# Patient Record
Sex: Female | Born: 1990 | Race: Black or African American | Hispanic: No | Marital: Single | Smoking: Current every day smoker
Health system: Southern US, Community
[De-identification: ages and names within clinical notes are randomized; demographics above are authoritative.]

## PROBLEM LIST (undated history)

## (undated) DIAGNOSIS — D649 Anemia, unspecified: Secondary | ICD-10-CM

## (undated) DIAGNOSIS — A549 Gonococcal infection, unspecified: Secondary | ICD-10-CM

## (undated) DIAGNOSIS — A749 Chlamydial infection, unspecified: Secondary | ICD-10-CM

## (undated) DIAGNOSIS — N390 Urinary tract infection, site not specified: Secondary | ICD-10-CM

## (undated) HISTORY — DX: Gonococcal infection, unspecified: A54.9

## (undated) HISTORY — PX: NO PAST SURGERIES: SHX2092

## (undated) HISTORY — DX: Anemia, unspecified: D64.9

---
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
## 2012-08-29 ENCOUNTER — Inpatient Hospital Stay (HOSPITAL_COMMUNITY): Payer: Self-pay

## 2012-08-29 ENCOUNTER — Emergency Department (HOSPITAL_COMMUNITY)
Admission: EM | Admit: 2012-08-29 | Discharge: 2012-08-29 | Disposition: A | Discharge status: Home / Self Care | Payer: Self-pay | Source: Home / Self Care | Attending: Family Medicine | Admitting: Family Medicine

## 2012-08-29 ENCOUNTER — Inpatient Hospital Stay (HOSPITAL_COMMUNITY)
Admission: AD | Admit: 2012-08-29 | Discharge: 2012-08-29 | Disposition: A | Discharge status: Home / Self Care | Payer: Self-pay | Source: Ambulatory Visit | Attending: Obstetrics & Gynecology | Admitting: Obstetrics & Gynecology

## 2012-08-29 ENCOUNTER — Encounter (HOSPITAL_COMMUNITY): Payer: Self-pay | Admitting: *Deleted

## 2012-08-29 ENCOUNTER — Encounter (HOSPITAL_COMMUNITY): Payer: Self-pay | Admitting: Emergency Medicine

## 2012-08-29 DIAGNOSIS — Z3202 Encounter for pregnancy test, result negative: Secondary | ICD-10-CM | POA: Insufficient documentation

## 2012-08-29 DIAGNOSIS — N898 Other specified noninflammatory disorders of vagina: Secondary | ICD-10-CM

## 2012-08-29 DIAGNOSIS — N939 Abnormal uterine and vaginal bleeding, unspecified: Secondary | ICD-10-CM

## 2012-08-29 DIAGNOSIS — N949 Unspecified condition associated with female genital organs and menstrual cycle: Secondary | ICD-10-CM | POA: Insufficient documentation

## 2012-08-29 DIAGNOSIS — R1032 Left lower quadrant pain: Secondary | ICD-10-CM | POA: Insufficient documentation

## 2012-08-29 DIAGNOSIS — N83209 Unspecified ovarian cyst, unspecified side: Secondary | ICD-10-CM | POA: Insufficient documentation

## 2012-08-29 HISTORY — DX: Urinary tract infection, site not specified: N39.0

## 2012-08-29 HISTORY — DX: Chlamydial infection, unspecified: A74.9

## 2012-08-29 LAB — URINALYSIS, ROUTINE W REFLEX MICROSCOPIC
Glucose, UA: NEGATIVE mg/dL
Protein, ur: NEGATIVE mg/dL
pH: 6 (ref 5.0–8.0)

## 2012-08-29 LAB — URINE MICROSCOPIC-ADD ON

## 2012-08-29 LAB — WET PREP, GENITAL
Trich, Wet Prep: NONE SEEN
Yeast Wet Prep HPF POC: NONE SEEN

## 2012-08-29 LAB — CBC
HCT: 36 % (ref 36.0–46.0)
Hemoglobin: 12.6 g/dL (ref 12.0–15.0)
MCHC: 35 g/dL (ref 30.0–36.0)
RBC: 4 MIL/uL (ref 3.87–5.11)
WBC: 13 10*3/uL — ABNORMAL HIGH (ref 4.0–10.5)

## 2012-08-29 LAB — POCT PREGNANCY, URINE: Preg Test, Ur: NEGATIVE

## 2012-08-29 MED ORDER — IBUPROFEN 800 MG PO TABS
800.0000 mg | ORAL_TABLET | Freq: Once | ORAL | Status: AC
  Administered 2012-08-29: 800 mg via ORAL
  Filled 2012-08-29: qty 1

## 2012-08-29 NOTE — ED Notes (Addendum)
Pt here with poss. Ectopic pregnancy. C/o abdominal cramping  Post menstrual period 6/8-15/2014.sx started Tuesday  Heavy bleeding/clots. Denies n/v

## 2012-08-29 NOTE — ED Provider Notes (Signed)
History     CSN: [id number]  Arrival date & time 08/29/12  1652   None     Chief Complaint  Patient presents with  . Abdominal Cramping    (Consider location/radiation/quality/duration/timing/severity/associated sxs/prior treatment) Patient is a 22 y.o. female presenting with vaginal bleeding. The history is provided by the patient.  Vaginal Bleeding Quality:  Bright red and clots Severity:  Moderate Duration:  5 days Progression:  Unchanged Chronicity:  New (ended lnmp on 6/15, started vag bleeding again on 6/17, continues like menses, using only condoms for birth control, having assoc left adnexal pain assoc with bleeding.) Menstrual history:  Regular Possible pregnancy: yes   Associated symptoms: no back pain, no dysuria, no fever and no vaginal discharge     History reviewed. No pertinent past medical history.  History reviewed. No pertinent past surgical history.  No family history on file.  History  Substance Use Topics  . Smoking status: Not on file  . Smokeless tobacco: Not on file  . Alcohol Use: Not on file    OB History   Grav Para Term Preterm Abortions TAB SAB Ect Mult Living                  Review of Systems  Constitutional: Negative.  Negative for fever.  Genitourinary: Positive for vaginal bleeding, menstrual problem and pelvic pain. Negative for dysuria, frequency, hematuria, flank pain and vaginal discharge.  Musculoskeletal: Negative for back pain.    Allergies  Review of patient's allergies indicates no known allergies.  Home Medications  No current outpatient prescriptions on file.  BP 114/67  Pulse 99  Temp(Src) 98.6 F (37 C) (Oral)  SpO2 98%  LMP 08/15/2012  Physical Exam  Nursing note and vitals reviewed. Constitutional: She is oriented to person, place, and time. She appears well-developed and well-nourished.  Abdominal: Soft. Bowel sounds are normal. She exhibits no distension and no mass. There is tenderness in the  suprapubic area and left lower quadrant. There is no rigidity, no rebound and no guarding.    Neurological: She is alert and oriented to person, place, and time.  Skin: Skin is warm and dry.    ED Course  Procedures (including critical care time)  Labs Reviewed - No data to display No results found.   1. Vaginal bleeding, abnormal       MDM  upreg neg.  Discussed with Raynelle Fanning at [facility] MAU re transfer.        Linna Hoff, MD 08/29/12 [phone]

## 2012-08-29 NOTE — MAU Provider Note (Signed)
History     CSN: [id number]  Arrival date and time: 08/29/12 1801   First Provider Initiated Contact with Patient 08/29/12 1841      Chief Complaint  Patient presents with  . Abdominal Pain   HPI Ms. Karen Wyatt is a 22 y.o. G0P0 who presents to MAU today with vaginal bleeding and abdominal pain. The patient states that she had a normal period from 08/15/12 to 08/22/12 and then started bleeding again on 08/24/12 and has continued to bleed since. The patient also states LLQ pain since that time. She denies N/V/D or constipation, fever, weakness, fatigue, dizziness or UTI symptoms. She has had occasional mild headaches. She is not currently sexually active and has not been x months. She is not on birth control.   OB History   Grav Para Term Preterm Abortions TAB SAB Ect Mult Living   0               Past Medical History  Diagnosis Date  . UTI (lower urinary tract infection)   . Chlamydia     Past Surgical History  Procedure Laterality Date  . No past surgeries      History reviewed. No pertinent family history.  History  Substance Use Topics  . Smoking status: Current Every Day Smoker -- 7.00 packs/day for 1 years    Types: Cigarettes  . Smokeless tobacco: Not on file  . Alcohol Use: Yes     Comment: occasionally    Allergies: No Known Allergies  Prescriptions prior to admission  Medication Sig Dispense Refill  . ibuprofen (ADVIL,MOTRIN) 200 MG tablet Take 200 mg by mouth every 6 (six) hours as needed for pain.        Review of Systems  Constitutional: Negative for fever and malaise/fatigue.  Gastrointestinal: Positive for abdominal pain. Negative for nausea, vomiting, diarrhea and constipation.  Genitourinary: Negative for dysuria, urgency and frequency.       Neg - vaginal discharge + Vaginal bleeding  Neurological: Negative for dizziness, loss of consciousness and weakness.   Physical Exam   Blood pressure 117/56, pulse 91, temperature 98.6 F (37 C),  temperature source Oral, resp. rate 18, height 5\' 2"  (1.575 m), weight 147 lb 12.8 oz (67.042 kg), last menstrual period 08/15/2012.  Physical Exam  Constitutional: She is oriented to person, place, and time. She appears well-developed and well-nourished. No distress.  HENT:  Head: Normocephalic and atraumatic.  Cardiovascular: Normal rate, regular rhythm and normal heart sounds.   Respiratory: Effort normal and breath sounds normal. No respiratory distress.  GI: Soft. Bowel sounds are normal. She exhibits no distension and no mass. There is tenderness (moderate tenderness to palpation of the lower abdomen more prominent at midline). There is no rebound and no guarding.  Genitourinary: Uterus is tender. Uterus is not enlarged. Cervix exhibits no motion tenderness, no discharge and no friability. Right adnexum displays tenderness. Right adnexum displays no mass. Left adnexum displays no mass and no tenderness. There is bleeding (scant bleeding noted in the vagina) around the vagina. No vaginal discharge found.  Neurological: She is alert and oriented to person, place, and time.  Skin: Skin is warm and dry. No erythema.  Psychiatric: She has a normal mood and affect.   Results for orders placed during the hospital encounter of 08/29/12 (from the past 24 hour(s))  URINALYSIS, ROUTINE W REFLEX MICROSCOPIC     Status: Abnormal   Collection Time    08/29/12  6:15 PM  Result Value Range   Color, Urine YELLOW  YELLOW   APPearance CLEAR  CLEAR   Specific Gravity, Urine 1.025  1.005 - 1.030   pH 6.0  5.0 - 8.0   Glucose, UA NEGATIVE  NEGATIVE mg/dL   Hgb urine dipstick LARGE (*) NEGATIVE   Bilirubin Urine NEGATIVE  NEGATIVE   Ketones, ur NEGATIVE  NEGATIVE mg/dL   Protein, ur NEGATIVE  NEGATIVE mg/dL   Urobilinogen, UA 1.0  0.0 - 1.0 mg/dL   Nitrite NEGATIVE  NEGATIVE   Leukocytes, UA SMALL (*) NEGATIVE  URINE MICROSCOPIC-ADD ON     Status: Abnormal   Collection Time    08/29/12  6:15 PM       Result Value Range   Squamous Epithelial / LPF FEW (*) RARE   WBC, UA 3-6  <3 WBC/hpf   RBC / HPF 3-6  <3 RBC/hpf   Bacteria, UA FEW (*) RARE   Urine-Other MUCOUS PRESENT    POCT PREGNANCY, URINE     Status: None   Collection Time    08/29/12  6:19 PM      Result Value Range   Preg Test, Ur NEGATIVE  NEGATIVE  CBC     Status: Abnormal   Collection Time    08/29/12  6:53 PM      Result Value Range   WBC 13.0 (*) 4.0 - 10.5 K/uL   RBC 4.00  3.87 - 5.11 MIL/uL   Hemoglobin 12.6  12.0 - 15.0 g/dL   HCT 16.1  09.6 - 04.5 %   MCV 90.0  78.0 - 100.0 fL   MCH 31.5  26.0 - 34.0 pg   MCHC 35.0  30.0 - 36.0 g/dL   RDW 40.9  81.1 - 91.4 %   Platelets 216  150 - 400 K/uL  WET PREP, GENITAL     Status: Abnormal   Collection Time    08/29/12  7:00 PM      Result Value Range   Yeast Wet Prep HPF POC NONE SEEN  NONE SEEN   Trich, Wet Prep NONE SEEN  NONE SEEN   Clue Cells Wet Prep HPF POC FEW (*) NONE SEEN   WBC, Wet Prep HPF POC FEW (*) NONE SEEN    MAU Course  Procedures None  MDM UPT, UA, Wet prep, GC/Chlamydia, CBC and [place] today  Results for orders placed during the hospital encounter of 08/29/12 (from the past 24 hour(s))  URINALYSIS, ROUTINE W REFLEX MICROSCOPIC     Status: Abnormal   Collection Time    08/29/12  6:15 PM      Result Value Range   Color, Urine YELLOW  YELLOW   APPearance CLEAR  CLEAR   Specific Gravity, Urine 1.025  1.005 - 1.030   pH 6.0  5.0 - 8.0   Glucose, UA NEGATIVE  NEGATIVE mg/dL   Hgb urine dipstick LARGE (*) NEGATIVE   Bilirubin Urine NEGATIVE  NEGATIVE   Ketones, ur NEGATIVE  NEGATIVE mg/dL   Protein, ur NEGATIVE  NEGATIVE mg/dL   Urobilinogen, UA 1.0  0.0 - 1.0 mg/dL   Nitrite NEGATIVE  NEGATIVE   Leukocytes, UA SMALL (*) NEGATIVE  URINE MICROSCOPIC-ADD ON     Status: Abnormal   Collection Time    08/29/12  6:15 PM      Result Value Range   Squamous Epithelial / LPF FEW (*) RARE   WBC, UA 3-6  <3 WBC/hpf   RBC / HPF 3-6  <3 RBC/hpf  Bacteria, UA FEW (*) RARE   Urine-Other MUCOUS PRESENT    POCT PREGNANCY, URINE     Status: None   Collection Time    08/29/12  6:19 PM      Result Value Range   Preg Test, Ur NEGATIVE  NEGATIVE  CBC     Status: Abnormal   Collection Time    08/29/12  6:53 PM      Result Value Range   WBC 13.0 (*) 4.0 - 10.5 K/uL   RBC 4.00  3.87 - 5.11 MIL/uL   Hemoglobin 12.6  12.0 - 15.0 g/dL   HCT 62.1  30.8 - 65.7 %   MCV 90.0  78.0 - 100.0 fL   MCH 31.5  26.0 - 34.0 pg   MCHC 35.0  30.0 - 36.0 g/dL   RDW 84.6  96.2 - 95.2 %   Platelets 216  150 - 400 K/uL  WET PREP, GENITAL     Status: Abnormal   Collection Time    08/29/12  7:00 PM      Result Value Range   Yeast Wet Prep HPF POC NONE SEEN  NONE SEEN   Trich, Wet Prep NONE SEEN  NONE SEEN   Clue Cells Wet Prep HPF POC FEW (*) NONE SEEN   WBC, Wet Prep HPF POC FEW (*) NONE SEEN   US Transvaginal Non-ob  08/29/2012   *RADIOLOGY REPORT*  Clinical Data: Bleeding and pelvic pain  TRANSABDOMINAL AND TRANSVAGINAL ULTRASOUND OF PELVIS Technique:  Both transabdominal and transvaginal ultrasound examinations of the pelvis were performed. Transabdominal technique was performed for global imaging of the pelvis including uterus, ovaries, adnexal regions, and pelvic cul-de-sac.  It was necessary to proceed with endovaginal exam following the transabdominal exam to visualize the uterus and ovaries.  Comparison:  None  Findings:  Uterus: 6.5 x 3.3 x 3.2 cm.  Uterus appears normal  Endometrium: 5.1 mm normal  Right ovary:  Large right adnexal mass measures 6.7 x 6.0 x 5.5 cm. The mass has  homogeneous low level echoes throughout.  No large calcification identified.  Left ovary: Normal appearance/no adnexal mass  Other findings: Minimal free fluid  IMPRESSION: Complex cystic mass right adnexa.  This are diffuse low level echoes which can be seen with a dermoid cyst.  Endometrioma and infection are other possibilities.  MRI of the pelvis is suggested for further  evaluation.   Original Report Authenticated By: Janeece Riggers, M.D.   US Pelvis Complete  08/29/2012   *RADIOLOGY REPORT*  Clinical Data: Bleeding and pelvic pain  TRANSABDOMINAL AND TRANSVAGINAL ULTRASOUND OF PELVIS Technique:  Both transabdominal and transvaginal ultrasound examinations of the pelvis were performed. Transabdominal technique was performed for global imaging of the pelvis including uterus, ovaries, adnexal regions, and pelvic cul-de-sac.  It was necessary to proceed with endovaginal exam following the transabdominal exam to visualize the uterus and ovaries.  Comparison:  None  Findings:  Uterus: 6.5 x 3.3 x 3.2 cm.  Uterus appears normal  Endometrium: 5.1 mm normal  Right ovary:  Large right adnexal mass measures 6.7 x 6.0 x 5.5 cm. The mass has  homogeneous low level echoes throughout.  No large calcification identified.  Left ovary: Normal appearance/no adnexal mass  Other findings: Minimal free fluid  IMPRESSION: Complex cystic mass right adnexa.  This are diffuse low level echoes which can be seen with a dermoid cyst.  Endometrioma and infection are other possibilities.  MRI of the pelvis is suggested for further evaluation.  Original Report Authenticated By: Janeece Riggers, M.D.    2054: Spoke with Dr. Penne Lash: will do LDH and AFP tumor marker. Plan for outpatient MRI and FU in the clinic in 3 weeks.    Assessment and Plan  2000 - Patient in [place]. Care turned over to [facility], CNM  Freddi Starr, PA-C  08/29/2012, 8:05 PM   1. Other and unspecified ovarian cyst    AFP, LDH pending Will do outpatient MRI of the pelvis FU with the GYN clinic in 3 weeks

## 2012-08-29 NOTE — MAU Note (Signed)
Pt reports she started having abd pain a vag bleeding that started on Tuesday. menstrual cycle ended last Sunday. LMP 08/15/12-08/22/12. Went to urgent care and was told to come here for further eval.

## 2012-08-31 LAB — URINE CULTURE: Culture: NO GROWTH

## 2012-08-31 LAB — GC/CHLAMYDIA PROBE AMP
CT Probe RNA: POSITIVE — AB
GC Probe RNA: POSITIVE — AB

## 2012-09-01 ENCOUNTER — Ambulatory Visit (INDEPENDENT_AMBULATORY_CARE_PROVIDER_SITE_OTHER): Payer: Self-pay | Admitting: Obstetrics and Gynecology

## 2012-09-01 VITALS — BP 105/65 | HR 84 | Temp 99.3°F | Ht 62.0 in | Wt 149.0 lb

## 2012-09-01 DIAGNOSIS — A5400 Gonococcal infection of lower genitourinary tract, unspecified: Secondary | ICD-10-CM

## 2012-09-01 DIAGNOSIS — A549 Gonococcal infection, unspecified: Secondary | ICD-10-CM

## 2012-09-01 DIAGNOSIS — A749 Chlamydial infection, unspecified: Secondary | ICD-10-CM

## 2012-09-01 MED ORDER — CEFTRIAXONE SODIUM 250 MG IJ SOLR
250.0000 mg | Freq: Once | INTRAMUSCULAR | Status: AC
  Administered 2012-09-01: 250 mg via INTRAMUSCULAR

## 2012-09-01 MED ORDER — AZITHROMYCIN 1 G PO PACK
1.0000 g | PACK | Freq: Once | ORAL | Status: AC
  Administered 2012-09-01: 1 g via ORAL

## 2012-09-01 NOTE — Progress Notes (Signed)
Std card filled and sent to [facility]

## 2012-09-03 ENCOUNTER — Encounter: Payer: Self-pay | Admitting: Obstetrics & Gynecology

## 2012-09-06 NOTE — MAU Provider Note (Signed)
Attestation of Attending Supervision of Advanced Practitioner (CNM/NP): Evaluation and management procedures were performed by the Advanced Practitioner under my supervision and collaboration. I have reviewed the Advanced Practitioner's note and chart, and I agree with the management and plan.  Ebin Palazzi H. 2:35 PM

## 2012-09-20 ENCOUNTER — Encounter: Payer: Self-pay | Admitting: Obstetrics & Gynecology

## 2012-10-01 ENCOUNTER — Encounter: Payer: Self-pay | Admitting: *Deleted

## 2012-10-25 ENCOUNTER — Encounter: Payer: Self-pay | Admitting: Obstetrics & Gynecology

## 2012-12-10 ENCOUNTER — Encounter: Payer: Self-pay | Admitting: Obstetrics & Gynecology

## 2013-06-29 ENCOUNTER — Encounter: Payer: Self-pay | Admitting: Obstetrics & Gynecology

## 2013-06-29 ENCOUNTER — Ambulatory Visit (INDEPENDENT_AMBULATORY_CARE_PROVIDER_SITE_OTHER): Payer: Self-pay | Admitting: Obstetrics & Gynecology

## 2013-06-29 VITALS — BP 119/63 | HR 81 | Temp 98.2°F | Ht 62.0 in | Wt 158.0 lb

## 2013-06-29 DIAGNOSIS — Z7251 High risk heterosexual behavior: Secondary | ICD-10-CM

## 2013-06-29 DIAGNOSIS — L02219 Cutaneous abscess of trunk, unspecified: Secondary | ICD-10-CM

## 2013-06-29 DIAGNOSIS — N83209 Unspecified ovarian cyst, unspecified side: Secondary | ICD-10-CM

## 2013-06-29 DIAGNOSIS — L03319 Cellulitis of trunk, unspecified: Secondary | ICD-10-CM

## 2013-06-29 DIAGNOSIS — L02215 Cutaneous abscess of perineum: Secondary | ICD-10-CM

## 2013-06-29 NOTE — Progress Notes (Signed)
Subjective:     Patient ID: Karen Wyatt, female   DOB: [birth year], 23 y.o.   MRN: [id number]  HPI Pt in to f/u on ov cyst that was noted 1 year prev.  She also had an STI on her last visit. Meds taken after ER visit.     Review of Systems     Objective:   Physical Exam BP 119/63  Pulse 81  Temp(Src) 98.2 F (36.8 C) (Oral)  Ht 5\' 2"  (1.575 m)  Wt 158 lb (71.668 kg)  BMI 28.89 kg/m2  LMP 06/01/2013  08/29/2012 Clinical Data: Bleeding and pelvic pain  TRANSABDOMINAL AND TRANSVAGINAL ULTRASOUND OF PELVIS  Technique: Both transabdominal and transvaginal ultrasound  examinations of the pelvis were performed. Transabdominal technique  was performed for global imaging of the pelvis including uterus,  ovaries, adnexal regions, and pelvic cul-de-sac.  It was necessary to proceed with endovaginal exam following the  transabdominal exam to visualize the uterus and ovaries.  Comparison: None  Findings:  Uterus: 6.5 x 3.3 x 3.2 cm. Uterus appears normal  Endometrium: 5.1 mm normal  Right ovary: Large right adnexal mass measures 6.7 x 6.0 x 5.5 cm.  The mass has homogeneous low level echoes throughout. No large  calcification identified.  Left ovary: Normal appearance/no adnexal mass  Other findings: Minimal free fluid  IMPRESSION:  Complex cystic mass right adnexa. This are diffuse low level  echoes which can be seen with a dermoid cyst. Endometrioma and  infection are other possibilities. MRI of the pelvis is suggested  for further evaluation      Assessment:     Ov cyst- need to check to see if it is still present       Plan:    Attestation of Attending Supervision of Resident: Evaluation and management procedures were performed by the [facility] under my supervision.  I have seen and examined the patient, reviewed the resident's note and chart, and I agree with the management and plan.  Anibal Hendersonarolyn L Harraway-Smith, M.D. 06/29/2013 4:53 PM  Pelvic sono

## 2013-06-29 NOTE — Progress Notes (Signed)
HPI:  Pt presents for f/u visit to discuss ovarian cyst seen on u/s during MAU visit last June. States she gets occasional pelvic pain weekly or every other week. Denies vaginal discharge. Had +gc and chlamydia at that MAU visit and [facility]. States she gets periods monthly. They last 6-7 days in length and are initially heavy. Is not on any birth control. Has been sexually active with just one female partner in the last year, no female partners in last year. Is not using any barrier methods w/ her female partner.   Has also had a "boil" on her bottom for the last 6 months. It has not drained. No fevers. It does not keep her from having sex or bowel movements.  ROS: See HPI  PMFSH: no surgical hx. No meds. Occasional alcohol. Smokes 4-6 cigs/day. No drugs.  PHYSICAL EXAM: BP 119/63  Pulse 81  Temp(Src) 98.2 F (36.8 C) (Oral)  Ht 5\' 2"  (1.575 m)  Wt 158 lb (71.668 kg)  BMI 28.89 kg/m2  LMP 06/01/2013 Gen: NAD, pleasant, cooperative HEENT: NCAT Lungs: NWOB Abdomen: soft, nontender to palpation GU: normal appearing external genitalia without lesions. Vagina is moist with thin white discharge. Cervix normal in appearance, ectropion present. No cervical motion tenderness or tenderness on bimanual exam. No adnexal masses palpable. ~3cm area of induration without fluctuance present on L inner buttock adjacent to the perineum. No erythema, skin breakdown, or drainage Neuro: grossly nonfocal, speech normal  ASSESSMENT/PLAN:  1. Ovarian cyst noted last June on u/s - repeat pelvic u/s to determine if cyst still present since has been almost 1 year - f/u in office in 4 weeks  2. Hx gonorrhea & chlamydia - re-screen today for gc/ct/trich  3. Buttock abscess: not ready for drainage yet - advise hot compresses 4 times daily - avoid squeezing  FOLLOW UP: F/u in 4 weeks to discuss u/s results.  GrenadaBrittany J. Pollie MeyerMcIntyre, MD Family Medicine PGY-2

## 2013-06-29 NOTE — Patient Instructions (Signed)

## 2013-06-30 LAB — GC/CHLAMYDIA PROBE AMP
CT PROBE, AMP APTIMA: NEGATIVE
GC PROBE AMP APTIMA: NEGATIVE

## 2013-07-11 ENCOUNTER — Ambulatory Visit (HOSPITAL_COMMUNITY)
Admission: RE | Admit: 2013-07-11 | Discharge: 2013-07-11 | Disposition: A | Discharge status: Home / Self Care | Payer: Self-pay | Source: Ambulatory Visit | Attending: Obstetrics & Gynecology | Admitting: Obstetrics & Gynecology

## 2013-07-11 DIAGNOSIS — N949 Unspecified condition associated with female genital organs and menstrual cycle: Secondary | ICD-10-CM | POA: Insufficient documentation

## 2013-07-11 DIAGNOSIS — N83209 Unspecified ovarian cyst, unspecified side: Secondary | ICD-10-CM

## 2013-08-03 ENCOUNTER — Telehealth: Payer: Self-pay | Admitting: *Deleted

## 2013-08-03 ENCOUNTER — Ambulatory Visit: Payer: Self-pay | Admitting: Obstetrics & Gynecology

## 2013-08-03 ENCOUNTER — Encounter: Payer: Self-pay | Admitting: *Deleted

## 2013-08-03 NOTE — Telephone Encounter (Signed)
Called patient to inform of missed appointment, no answer, left message for patient to call and reschedule appointment.  Letter sent.

## 2013-11-16 ENCOUNTER — Emergency Department (HOSPITAL_COMMUNITY)
Admission: EM | Admit: 2013-11-16 | Discharge: 2013-11-16 | Disposition: A | Discharge status: Home / Self Care | Payer: Self-pay | Attending: Emergency Medicine | Admitting: Emergency Medicine

## 2013-11-16 ENCOUNTER — Emergency Department (HOSPITAL_COMMUNITY): Payer: Self-pay

## 2013-11-16 ENCOUNTER — Encounter (HOSPITAL_COMMUNITY): Payer: Self-pay | Admitting: Emergency Medicine

## 2013-11-16 DIAGNOSIS — Z91040 Latex allergy status: Secondary | ICD-10-CM | POA: Insufficient documentation

## 2013-11-16 DIAGNOSIS — Z87440 Personal history of urinary (tract) infections: Secondary | ICD-10-CM | POA: Insufficient documentation

## 2013-11-16 DIAGNOSIS — Z8619 Personal history of other infectious and parasitic diseases: Secondary | ICD-10-CM | POA: Insufficient documentation

## 2013-11-16 DIAGNOSIS — R109 Unspecified abdominal pain: Secondary | ICD-10-CM | POA: Insufficient documentation

## 2013-11-16 DIAGNOSIS — D649 Anemia, unspecified: Secondary | ICD-10-CM | POA: Insufficient documentation

## 2013-11-16 DIAGNOSIS — N730 Acute parametritis and pelvic cellulitis: Secondary | ICD-10-CM | POA: Insufficient documentation

## 2013-11-16 DIAGNOSIS — F17200 Nicotine dependence, unspecified, uncomplicated: Secondary | ICD-10-CM | POA: Insufficient documentation

## 2013-11-16 DIAGNOSIS — Z3202 Encounter for pregnancy test, result negative: Secondary | ICD-10-CM | POA: Insufficient documentation

## 2013-11-16 LAB — CBC WITH DIFFERENTIAL/PLATELET
Basophils Absolute: 0 10*3/uL (ref 0.0–0.1)
Basophils Relative: 0 % (ref 0–1)
EOS ABS: 0.1 10*3/uL (ref 0.0–0.7)
Eosinophils Relative: 1 % (ref 0–5)
HCT: 32.5 % — ABNORMAL LOW (ref 36.0–46.0)
Hemoglobin: 11.3 g/dL — ABNORMAL LOW (ref 12.0–15.0)
LYMPHS ABS: 1.4 10*3/uL (ref 0.7–4.0)
LYMPHS PCT: 14 % (ref 12–46)
MCH: 30.8 pg (ref 26.0–34.0)
MCHC: 34.8 g/dL (ref 30.0–36.0)
MCV: 88.6 fL (ref 78.0–100.0)
Monocytes Absolute: 0.8 10*3/uL (ref 0.1–1.0)
Monocytes Relative: 8 % (ref 3–12)
NEUTROS PCT: 77 % (ref 43–77)
Neutro Abs: 7.7 10*3/uL (ref 1.7–7.7)
PLATELETS: 356 10*3/uL (ref 150–400)
RBC: 3.67 MIL/uL — AB (ref 3.87–5.11)
RDW: 12.4 % (ref 11.5–15.5)
WBC: 10.1 10*3/uL (ref 4.0–10.5)

## 2013-11-16 LAB — COMPREHENSIVE METABOLIC PANEL
ALK PHOS: 55 U/L (ref 39–117)
ALT: 8 U/L (ref 0–35)
AST: 12 U/L (ref 0–37)
Albumin: 2.8 g/dL — ABNORMAL LOW (ref 3.5–5.2)
Anion gap: 16 — ABNORMAL HIGH (ref 5–15)
BUN: 5 mg/dL — ABNORMAL LOW (ref 6–23)
CO2: 24 meq/L (ref 19–32)
Calcium: 9.1 mg/dL (ref 8.4–10.5)
Chloride: 98 mEq/L (ref 96–112)
Creatinine, Ser: 0.68 mg/dL (ref 0.50–1.10)
GFR calc non Af Amer: >90 mL/min (ref >90)
GLUCOSE: 98 mg/dL (ref 70–99)
POTASSIUM: 3.9 meq/L (ref 3.7–5.3)
SODIUM: 138 meq/L (ref 137–147)
TOTAL PROTEIN: 7.8 g/dL (ref 6.0–8.3)
Total Bilirubin: 0.5 mg/dL (ref 0.3–1.2)

## 2013-11-16 LAB — URINE MICROSCOPIC-ADD ON

## 2013-11-16 LAB — URINALYSIS, ROUTINE W REFLEX MICROSCOPIC
GLUCOSE, UA: NEGATIVE mg/dL
HGB URINE DIPSTICK: NEGATIVE
Ketones, ur: 15 mg/dL — AB
Nitrite: NEGATIVE
PROTEIN: 30 mg/dL — AB
Specific Gravity, Urine: 1.023 (ref 1.005–1.030)
Urobilinogen, UA: 4 mg/dL — ABNORMAL HIGH (ref 0.0–1.0)
pH: 6.5 (ref 5.0–8.0)

## 2013-11-16 LAB — WET PREP, GENITAL
Trich, Wet Prep: NONE SEEN
Yeast Wet Prep HPF POC: NONE SEEN

## 2013-11-16 LAB — HIV ANTIBODY (ROUTINE TESTING W REFLEX): HIV: NONREACTIVE

## 2013-11-16 LAB — RPR

## 2013-11-16 LAB — POC URINE PREG, ED: Preg Test, Ur: NEGATIVE

## 2013-11-16 MED ORDER — ONDANSETRON HCL 4 MG/2ML IJ SOLN
4.0000 mg | Freq: Once | INTRAMUSCULAR | Status: AC
Start: 2013-11-16 — End: 2013-11-16
  Administered 2013-11-16: 4 mg via INTRAVENOUS
  Filled 2013-11-16: qty 2

## 2013-11-16 MED ORDER — HYDROMORPHONE HCL PF 1 MG/ML IJ SOLN
1.0000 mg | Freq: Once | INTRAMUSCULAR | Status: AC
  Administered 2013-11-16: 1 mg via INTRAVENOUS
  Filled 2013-11-16: qty 1

## 2013-11-16 MED ORDER — ONDANSETRON HCL 4 MG PO TABS: 4.0000 mg | ORAL_TABLET | Freq: Four times a day (QID) | ORAL | Status: AC

## 2013-11-16 MED ORDER — ONDANSETRON HCL 4 MG/2ML IJ SOLN
4.0000 mg | Freq: Once | INTRAMUSCULAR | Status: AC
  Administered 2013-11-16: 4 mg via INTRAVENOUS
  Filled 2013-11-16: qty 2

## 2013-11-16 MED ORDER — IOHEXOL 300 MG/ML  SOLN
80.0000 mL | Freq: Once | INTRAMUSCULAR | Status: AC | PRN
  Administered 2013-11-16: 80 mL via INTRAVENOUS

## 2013-11-16 MED ORDER — SODIUM CHLORIDE 0.9 % IV BOLUS (SEPSIS)
1000.0000 mL | Freq: Once | INTRAVENOUS | Status: AC
  Administered 2013-11-16: 1000 mL via INTRAVENOUS

## 2013-11-16 MED ORDER — DOXYCYCLINE HYCLATE 100 MG PO CAPS: 100.0000 mg | ORAL_CAPSULE | Freq: Two times a day (BID) | ORAL | Status: AC

## 2013-11-16 MED ORDER — MORPHINE SULFATE 4 MG/ML IJ SOLN
4.0000 mg | Freq: Once | INTRAMUSCULAR | Status: AC
Start: 2013-11-16 — End: 2013-11-16
  Administered 2013-11-16: 4 mg via INTRAVENOUS
  Filled 2013-11-16: qty 1

## 2013-11-16 MED ORDER — IOHEXOL 300 MG/ML  SOLN
25.0000 mL | INTRAMUSCULAR | Status: AC
  Administered 2013-11-16: 25 mL via ORAL

## 2013-11-16 MED ORDER — CEFTRIAXONE SODIUM 500 MG IJ SOLR
250.0000 mg | Freq: Once | INTRAMUSCULAR | Status: AC
  Administered 2013-11-16: 250 mg via INTRAVENOUS
  Filled 2013-11-16 (×2): qty 250

## 2013-11-16 MED ORDER — HYDROMORPHONE HCL PF 1 MG/ML IJ SOLN
0.5000 mg | Freq: Once | INTRAMUSCULAR | Status: AC
  Administered 2013-11-16: 0.5 mg via INTRAVENOUS
  Filled 2013-11-16: qty 1

## 2013-11-16 MED ORDER — OXYCODONE-ACETAMINOPHEN 5-325 MG PO TABS: 1.0000 | ORAL_TABLET | Freq: Four times a day (QID) | ORAL | Status: AC | PRN

## 2013-11-16 NOTE — Discharge Instructions (Signed)
Pelvic Inflammatory Disease °Pelvic inflammatory disease (PID) refers to an infection in some or all of the female organs. The infection can be in the uterus, ovaries, fallopian tubes, or the surrounding tissues in the pelvis. PID can cause abdominal or pelvic pain that comes on suddenly (acute pelvic pain). PID is a serious infection because it can lead to lasting (chronic) pelvic pain or the inability to have children (infertile).  °CAUSES  °The infection is often caused by the normal bacteria found in the vaginal tissues. PID may also be caused by an infection that is spread during sexual contact. PID can also occur following:  °· The birth of a baby.   °· A miscarriage.   °· An abortion.   °· Major pelvic surgery.   °· The use of an intrauterine device (IUD).   °· A sexual assault.   °RISK FACTORS °Certain factors can put a person at higher risk for PID, such as: °· Being younger than 25 years. °· Being sexually active at a young age. °· Using nonbarrier contraception. °· Having multiple sexual partners. °· Having sex with someone who has symptoms of a genital infection. °· Using oral contraception. °Other times, certain behaviors can increase the possibility of getting PID, such as: °· Having sex during your period. °· Using a vaginal douche. °· Having an intrauterine device (IUD) in place. °SYMPTOMS  °· Abdominal or pelvic pain.   °· Fever.   °· Chills.   °· Abnormal vaginal discharge. °· Abnormal uterine bleeding.   °· Unusual pain shortly after finishing your period. °DIAGNOSIS  °Your caregiver will choose some of the following methods to make a diagnosis, such as:  °· Performing a physical exam and history. A pelvic exam typically reveals a very tender uterus and surrounding pelvis.   °· Ordering laboratory tests including a pregnancy test, blood tests, and urine test.  °· Ordering cultures of the vagina and cervix to check for a sexually transmitted infection (STI). °· Performing an ultrasound.    °· Performing a laparoscopic procedure to look inside the pelvis.   °TREATMENT  °· Antibiotic medicines may be prescribed and taken by mouth.   °· Sexual partners may be treated when the infection is caused by a sexually transmitted disease (STD).   °· Hospitalization may be needed to give antibiotics intravenously. °· Surgery may be needed, but this is rare. °It may take weeks until you are completely well. If you are diagnosed with PID, you should also be checked for human immunodeficiency virus (HIV).   °HOME CARE INSTRUCTIONS  °· If given, take your antibiotics as directed. Finish the medicine even if you start to feel better.   °· Only take over-the-counter or prescription medicines for pain, discomfort, or fever as directed by your caregiver.   °· Do not have sexual intercourse until treatment is completed or as directed by your caregiver. If PID is confirmed, your recent sexual partner(s) will need treatment.   °· Keep your follow-up appointments. °SEEK MEDICAL CARE IF:  °· You have increased or abnormal vaginal discharge.   °· You need prescription medicine for your pain.   °· You vomit.   °· You cannot take your medicines.   °· Your partner has an STD.   °SEEK IMMEDIATE MEDICAL CARE IF:  °· You have a fever.   °· You have increased abdominal or pelvic pain.   °· You have chills.   °· You have pain when you urinate.   °· You are not better after 72 hours following treatment.   °MAKE SURE YOU:  °· Understand these instructions. °· Will watch your condition. °· Will get help right away if you are not doing well or get worse. °  Document Released: 02/24/2005 Document Revised: 06/21/2012 Document Reviewed: 02/20/2011 °[employer]® Patient Information ©2015 [employer]. This information is not intended to replace advice given to you by your health care provider. Make sure you discuss any questions you have with your health care provider. ° °

## 2013-11-16 NOTE — ED Notes (Signed)
Pt has returned from being out of the department; pt placed back on continuous pulse oximetry and blood pressure cuff; family at bedside 

## 2013-11-16 NOTE — ED Notes (Signed)
Lower abd pain and rt flank pain x several days pt states that she did vomit a few days ago but only that once LMP was 9/2 and last BM was Monday normal. Denies vag d/c states hurts to cough

## 2013-11-16 NOTE — ED Provider Notes (Signed)
  Lab abnormalities nonspecific Pain managed - no vomiting GI follow up if CT negative or able to discharge Pain and nausea medication for home.  CT = TOA Per gyn: Rocephin, [place] to eval flow If normal flow outpatient tx with doxy and pain meds  5:25: Reassessment and introduction to the patient:  Patient resting comfortably. She is aware of plan and waiting for ultrasound now. No complaints. All questions answered.   7:20:  US showing TOA without torsion. As per discussion with GYN, patient can be discharged home with close GYN follow up and with Doxycycline Rx, pain management. Referral made to GI for evaluation of elevated bilirubin.   Arnoldo Hooker, PA-C 11/16/13 [id number]

## 2013-11-16 NOTE — ED Notes (Signed)
Spoke with CT. Patient on the list.

## 2013-11-16 NOTE — ED Notes (Signed)
PA at the bedside.

## 2013-11-16 NOTE — ED Notes (Signed)
Patient finished contrast. CT made aware.  

## 2013-11-16 NOTE — Discharge Planning (Signed)
[facility] Community Liaison   Spoke to patient about primary care resources and establishing care with a provider. Resource guide and my contact information provided for any future questions or concerns. No other Community Liaison needs identified at this time.

## 2013-11-16 NOTE — ED Provider Notes (Signed)
CSN: [id number]     Arrival date & time 11/16/13  0800 History   First MD Initiated Contact with Patient 11/16/13 [phone]     Chief Complaint  Patient presents with  . Abdominal Pain  . Flank Pain     (Consider location/radiation/quality/duration/timing/severity/associated sxs/prior Treatment) HPI  Patient to the ER with complaints of diffuse abdominal pain that she feels is worse bilateral lower abdomen, RUQ, periumbilical and epigastric abdomen without vomiting. She reports having pain two weeks ago before starting her menstrual cycle. Once her cycle started she no longer had pain. Her menstrual cycle completed yesterday and now her pain has returned. She reports dysuria but no vaginal discharge or pain. She has not had any past surgeries but does have history of frequent UTIs. Reports no bowel movement in the past 2 days which is unusual for her since she uses the restroom daily. She has not had fevers, diarrhea, vomiting, rectal bleeding, headache or myalgias.   Past Medical History  Diagnosis Date  . UTI (lower urinary tract infection)   . Chlamydia    Past Surgical History  Procedure Laterality Date  . No past surgeries     No family history on file. History  Substance Use Topics  . Smoking status: Current Every Day Smoker -- 7.00 packs/day for 1 years    Types: Cigarettes  . Smokeless tobacco: Not on file  . Alcohol Use: Yes     Comment: occasionally   OB History   Grav Para Term Preterm Abortions TAB SAB Ect Mult Living   0              Review of Systems   Review of Systems  Gen: no weight loss, fevers, chills, night sweats  Eyes: no occular draining, occular pain,  No visual changes  Nose: no epistaxis or rhinorrhea  Mouth: no dental pain, no sore throat  Neck: no neck pain  Lungs: No hemoptysis. No wheezing or coughing CV:  No palpitations, dependent edema or orthopnea. No chest pain Abd: no diarrhea. No nausea or vomiting, + abdominal pain  GU: no dysuria or  gross hematuria  MSK:  No muscle weakness, No muscular pain Neuro: no headache, no focal neurologic deficits  Skin: no rash , no wounds Psyche: no complaints of depression or anxiety    Allergies  Latex  Home Medications   Prior to Admission medications   Medication Sig Start Date End Date Taking? Authorizing Provider  ibuprofen (ADVIL,MOTRIN) 200 MG tablet Take 400 mg by mouth every 6 (six) hours as needed for headache or moderate pain.   Yes Historical Provider, MD   BP 109/52  Pulse 106  Temp(Src) 99.2 F (37.3 C) (Oral)  Resp 20  SpO2 99% Physical Exam  Nursing note and vitals reviewed. Constitutional: She appears well-developed and well-nourished. No distress.  HENT:  Head: Normocephalic and atraumatic.  Eyes: Pupils are equal, round, and reactive to light.  Neck: Normal range of motion. Neck supple.  Cardiovascular: Normal rate and regular rhythm.   Pulmonary/Chest: Effort normal.  Abdominal: Soft. Bowel sounds are normal. She exhibits no distension and no fluid wave. There is tenderness (mod/severe diffuse abdomenal pain that does not localize). There is no rigidity, no rebound, no guarding and no CVA tenderness.  Neurological: She is alert.  Skin: Skin is warm and dry.    ED Course  Procedures (including critical care time) Labs Review Labs Reviewed  CBC WITH DIFFERENTIAL - Abnormal; Notable for the following:  RBC 3.67 (*)    Hemoglobin 11.3 (*)    HCT 32.5 (*)    All other components within normal limits  URINALYSIS, ROUTINE W REFLEX MICROSCOPIC - Abnormal; Notable for the following:    Color, Urine AMBER (*)    APPearance CLOUDY (*)    Bilirubin Urine SMALL (*)    Ketones, ur 15 (*)    Protein, ur 30 (*)    Urobilinogen, UA 4.0 (*)    Leukocytes, UA MODERATE (*)    All other components within normal limits  URINE MICROSCOPIC-ADD ON - Abnormal; Notable for the following:    Squamous Epithelial / LPF MANY (*)    All other components within normal  limits  COMPREHENSIVE METABOLIC PANEL  POC URINE PREG, ED    Imaging Review No results found.   EKG Interpretation None      MDM   Final diagnoses:  None   9:18 am The patients urine shows her to have elevated Urobilinogen 4.0 which has not been seen in any of her previous visits. She has history of gonorrhea and chlamydia. Waiting for liver enzymes to result- possibly concerned for hepatitis or obstruction. Hemolytic anemia also a consideration, she has mildly low RBC, hemoglobin and HCT. The rest of her cbc is unremarkable. At end of visit, will give referral to GI to follow-up with this. Discussed results with patient.  CT scan of the abdomen ordered as plain films showed enteritis which typically is not so painful for patient on pelvic exam. The pelvic exam shows she has tenderness to bilateral ovaries. The CT shows possible tubo-ovarian abscess to left side. I spoke with [facility] who says that abscess is not likely with no fever and no white count elevation. She recommends treating like PID and giving Rocephin IV . Give Doxycycline 100 mg BID for 14 days. Reports even if there is abscess typically the treatment is outpatient. She also recommends obtaining US to r/o ovarian torsion.  At end of shift, patient handed off to [employer], PA-C. Pts ultrasound of ovaries are pending to r/o torsion.  Dorthula Matas, PA-C 11/16/13 1616

## 2013-11-16 NOTE — ED Notes (Signed)
Patient returned from CT

## 2013-11-16 NOTE — ED Notes (Signed)
Patient returned from US.

## 2013-11-17 LAB — GC/CHLAMYDIA PROBE AMP
CT Probe RNA: NEGATIVE
GC Probe RNA: NEGATIVE

## 2013-11-17 NOTE — ED Provider Notes (Signed)
Medical screening examination/treatment/procedure(s) were performed by non-physician practitioner and as supervising physician I was immediately available for consultation/collaboration.   EKG Interpretation None        Suly Vukelich, MD 11/17/13 0722 

## 2013-11-17 NOTE — ED Provider Notes (Signed)
Medical screening examination/treatment/procedure(s) were performed by non-physician practitioner and as supervising physician I was immediately available for consultation/collaboration.   EKG Interpretation None        Vanetta Mulders, MD 11/17/13 [phone]

## 2013-11-17 NOTE — Progress Notes (Signed)
  CARE MANAGEMENT ED NOTE 11/17/2013  Patient:  Karen Wyatt, Karen Wyatt   Account Number:  [id number]  Date Initiated:  11/17/2013  Documentation initiated by:  Ferdinand Cava  Subjective/Objective Assessment:   23 yo female contacted CM regarding affordability of ED discharge prescriptions     Subjective/Objective Assessment Detail:     Action/Plan:   Patient will use [facility] program to afford discharge prescriptions   Action/Plan Detail:   Anticipated DC Date:       Status Recommendation to Physician:   Result of Recommendation:  Agreed    DC Planning Services  [facility] Program    Choice offered to / List presented to:            Status of service:  Completed, signed off  ED Comments:   ED Comments Detail:  This CM received phone call from the patient and she stated that she was informed the previous day to contact the CM department regarding the [facility] program for her discharge prescriptions. This CM discussed the [facility] program with the patient and explained that a copay of $3.00 for each medication, narcotics are not covered in the system, and she can only use the program 1 time in a year and the prescription has to be filled within 7 days of the prescription date. The patient verbalized understanding and stated she would like to use the [facility] program. The patient stated that she uses the [employer] on [address]. This CM informed the patient that the [facility] letter would be faxed to [facility] of her choice and to contact the CM department if any questions or concerns arose with the [facility] letter. The patient verbalized understanding and had no other questions or concerns

## 2013-12-15 ENCOUNTER — Encounter: Payer: Self-pay | Admitting: Internal Medicine

## 2014-02-14 ENCOUNTER — Ambulatory Visit: Payer: Self-pay | Admitting: Internal Medicine

## 2015-03-23 IMAGING — CT CT ABD-PELV W/ CM
2 of 4 series · 16 of 46 positions shown, 18 images · IV contrast (Omni 300)
Comparison: None.

CLINICAL DATA: Right mid and lower abdominal pain.

EXAM:
CT ABDOMEN AND PELVIS WITH CONTRAST
TECHNIQUE: Multidetector CT imaging of the abdomen and pelvis was performed
using the standard protocol following bolus administration of
intravenous contrast.
CONTRAST:  80mL OMNIPAQUE IOHEXOL 300 MG/ML  SOLN

[Series 2: abd/ pelvis 5.0 i30f 1 · axial · 0.58mm/px · z∈[-150,+275]mm · 13 of 93 slices shown, 15 images]
[im 4/93  soft-tissue]
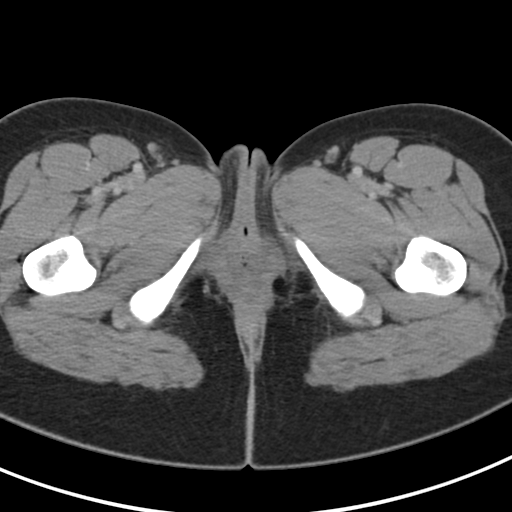
[im 4/93  bone]
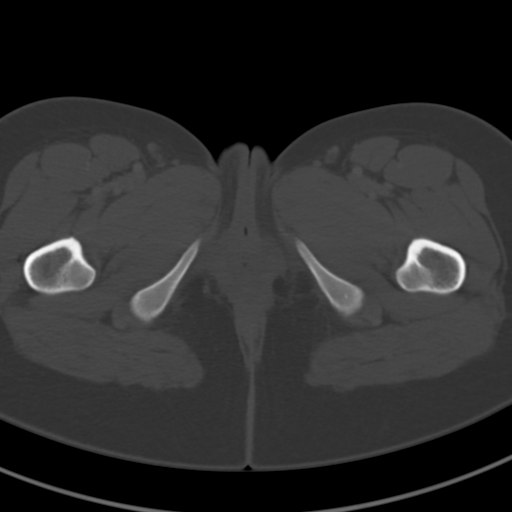
[im 11/93  soft-tissue]
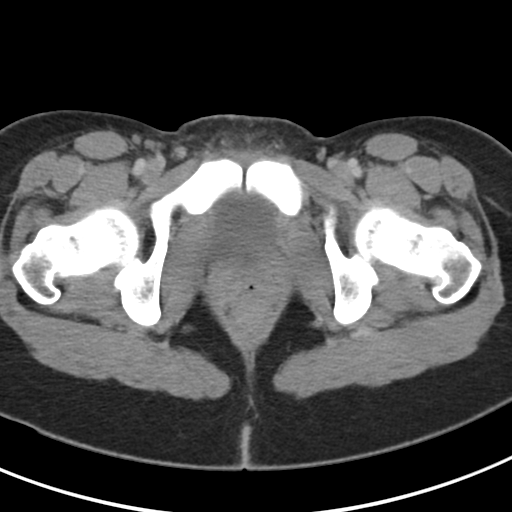
[im 18/93  soft-tissue]
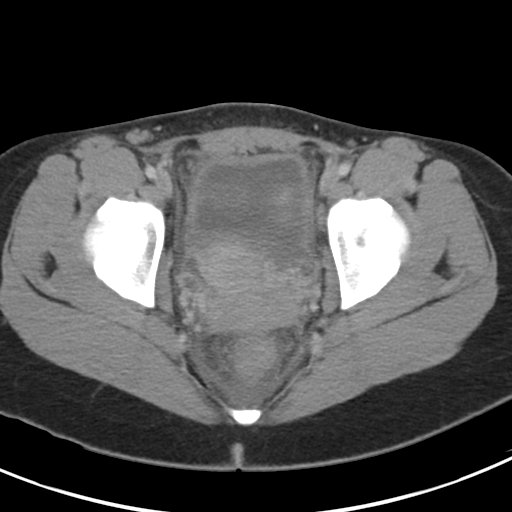
[im 25/93  soft-tissue]
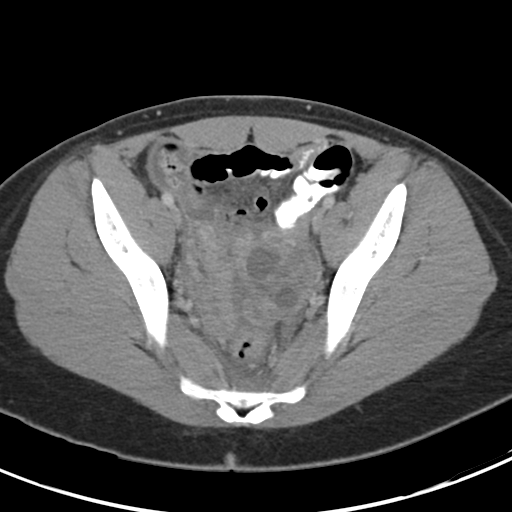
[im 32/93  soft-tissue]
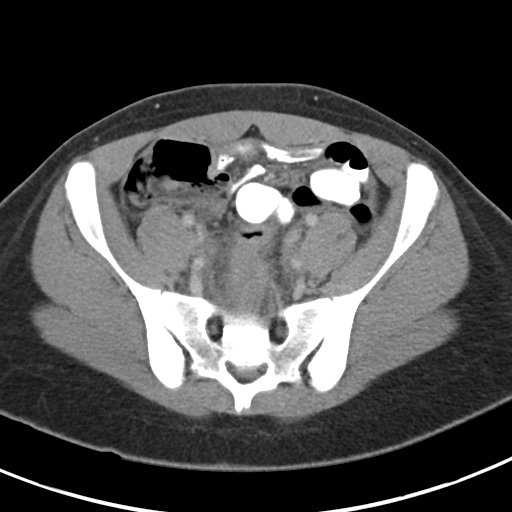
[im 39/93  soft-tissue]
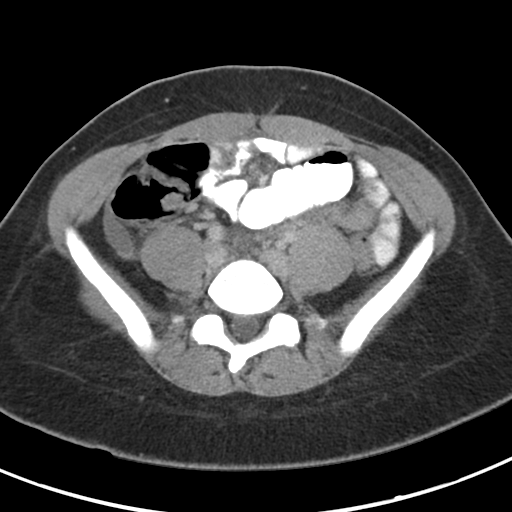
[im 47/93  soft-tissue]
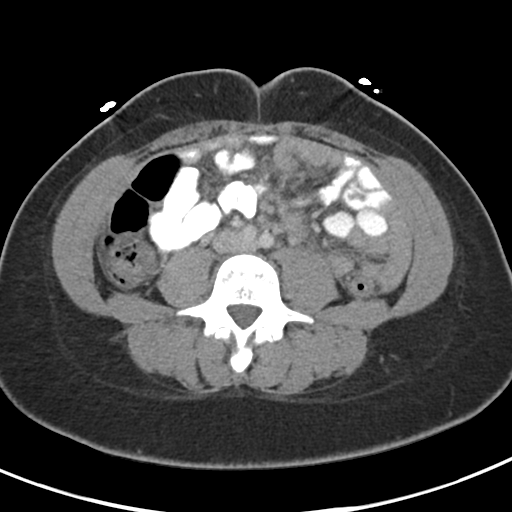
[im 54/93  soft-tissue]
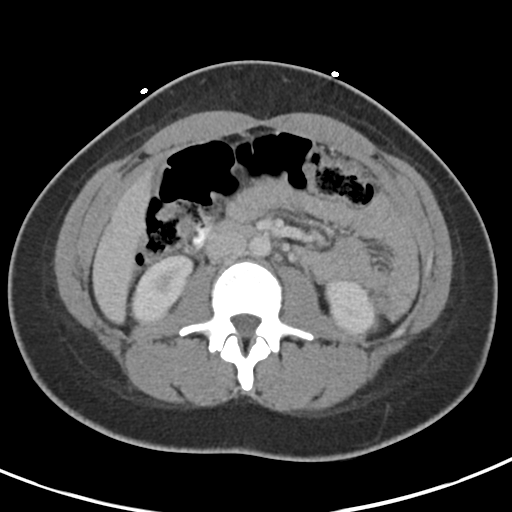
[im 61/93  soft-tissue]
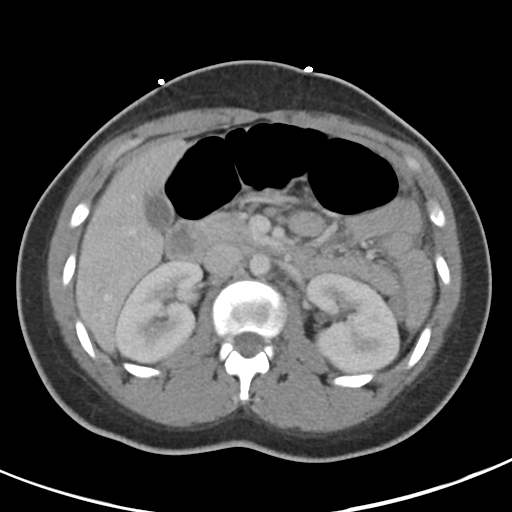
[im 61/93  bone]
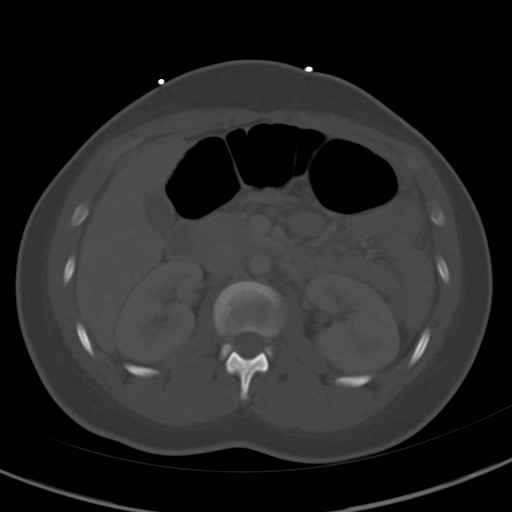
[im 68/93  soft-tissue]
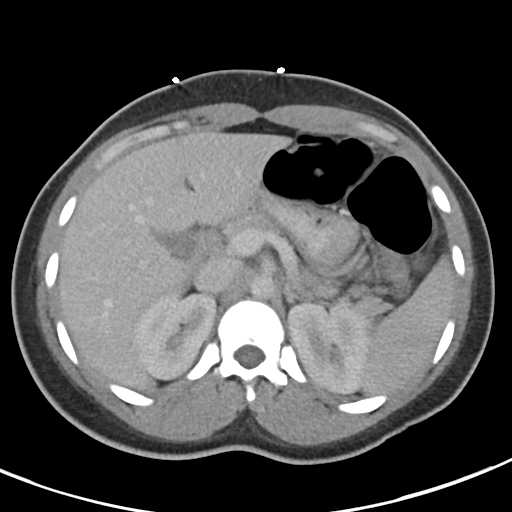
[im 75/93  soft-tissue]
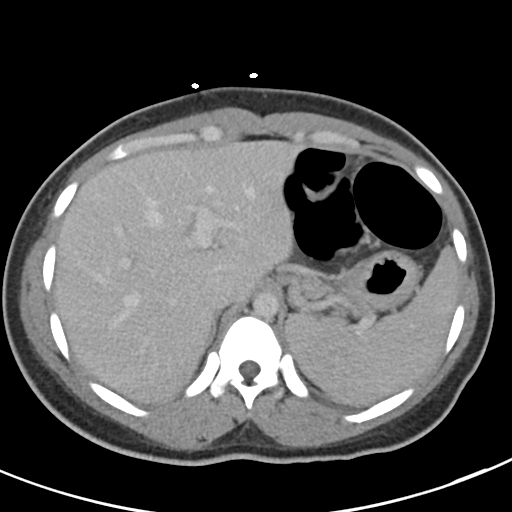
[im 82/93  soft-tissue]
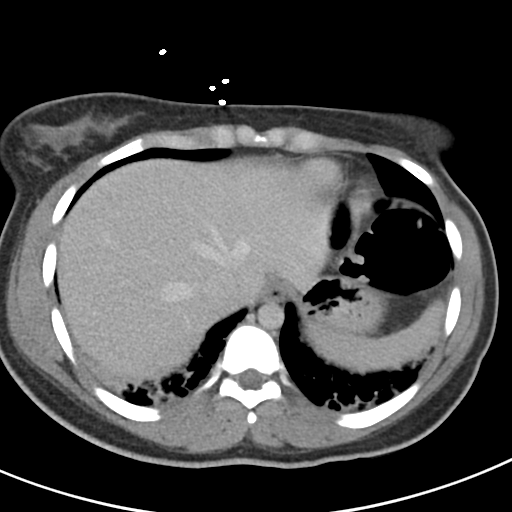
[im 89/93  soft-tissue]
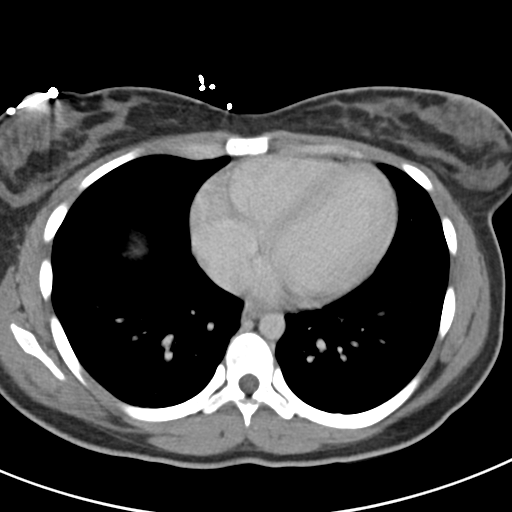

[Series 5: coronals · coronal · 0.70mm/px · 3 of 179 slices shown]
[im 60/179  soft-tissue]
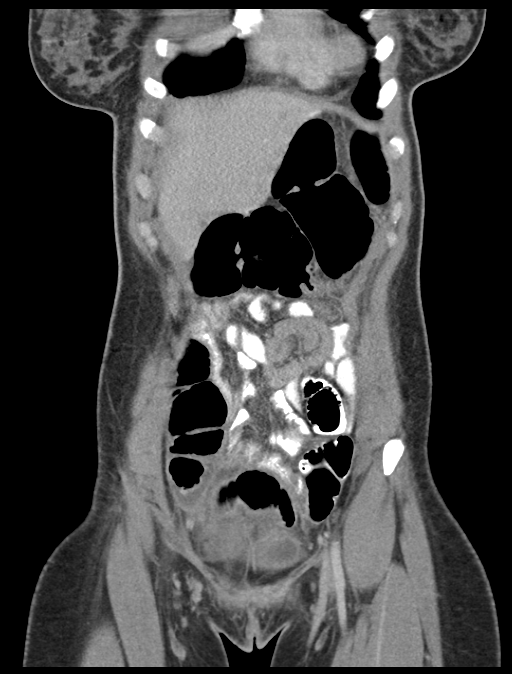
[im 80/179  soft-tissue]
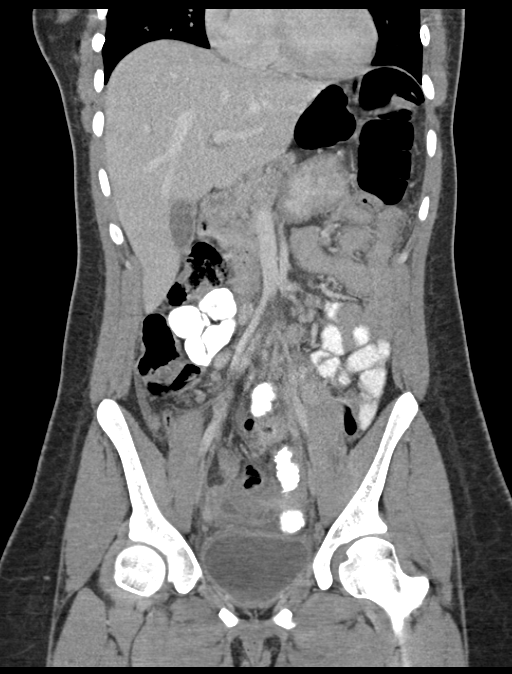
[im 99/179  soft-tissue]
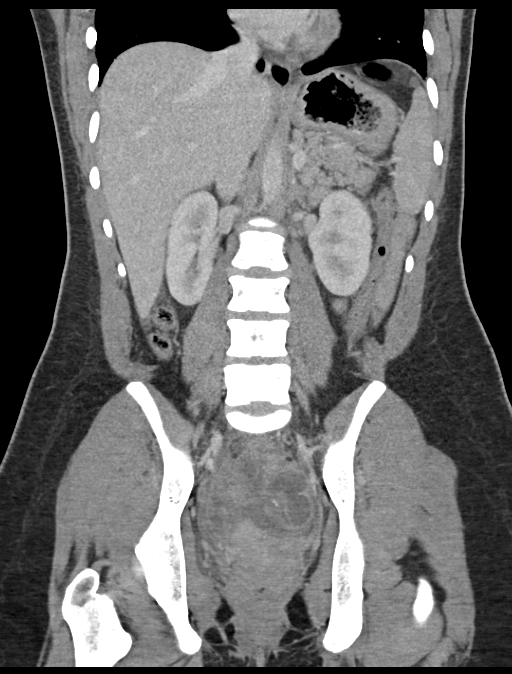

[16 of 46 positions shown; findings below may reference images not displayed]

FINDINGS: Liver:  No liver masses or other abnormality identified

Gallbladder/Biliary:  Unremarkable.

Pancreas: No mass, inflammatory changes, or other parenchymal
abnormality identified.

Spleen:  Within normal limits in size and appearance.

Adrenal Glands:  No mass identified.

Kidneys/Urinary Tract: No masses identified. No evidence of
hydronephrosis.

Lymph Nodes:  No pathologically enlarged lymph nodes identified.

Pelvic/Reproductive Organs: Uterus appears normal. A complex rim
enhancing cystic lesion is seen in the left adnexa with a somewhat
tubular appearance. There surrounding inflammatory changes in the
left adnexa. This measures 3.9 x 4.2 cm, and is suspicious for a
tubo-ovarian abscess.

Bowel/peritoneum: Normal appendix visualized. Mildly dilated small
bowel loops seen in the lower abdomen pelvis, likely due to to
reactive ileus .

Vascular:  No evidence of abdominal aortic aneurysm.

Musculoskeletal:  No suspicious bone lesions identified.

Other:  Bibasilar atelectasis noted.
IMPRESSION: Complex rim enhancing cystic lesion in the left adnexa measuring 4
cm, highly suspicious for tubo-ovarian abscess.

Mildly dilated small bowel loops in the lower abdomen pelvis, likely
due to reactive ileus.

Bibasilar atelectasis.

## 2015-03-23 IMAGING — CR DG ABDOMEN 2V
2 series · 2 of 2 positions shown · non-contrast
Comparison: None.

CLINICAL DATA: Abdominal pain

EXAM:
ABDOMEN - 2 VIEW

[w abdomen upright]
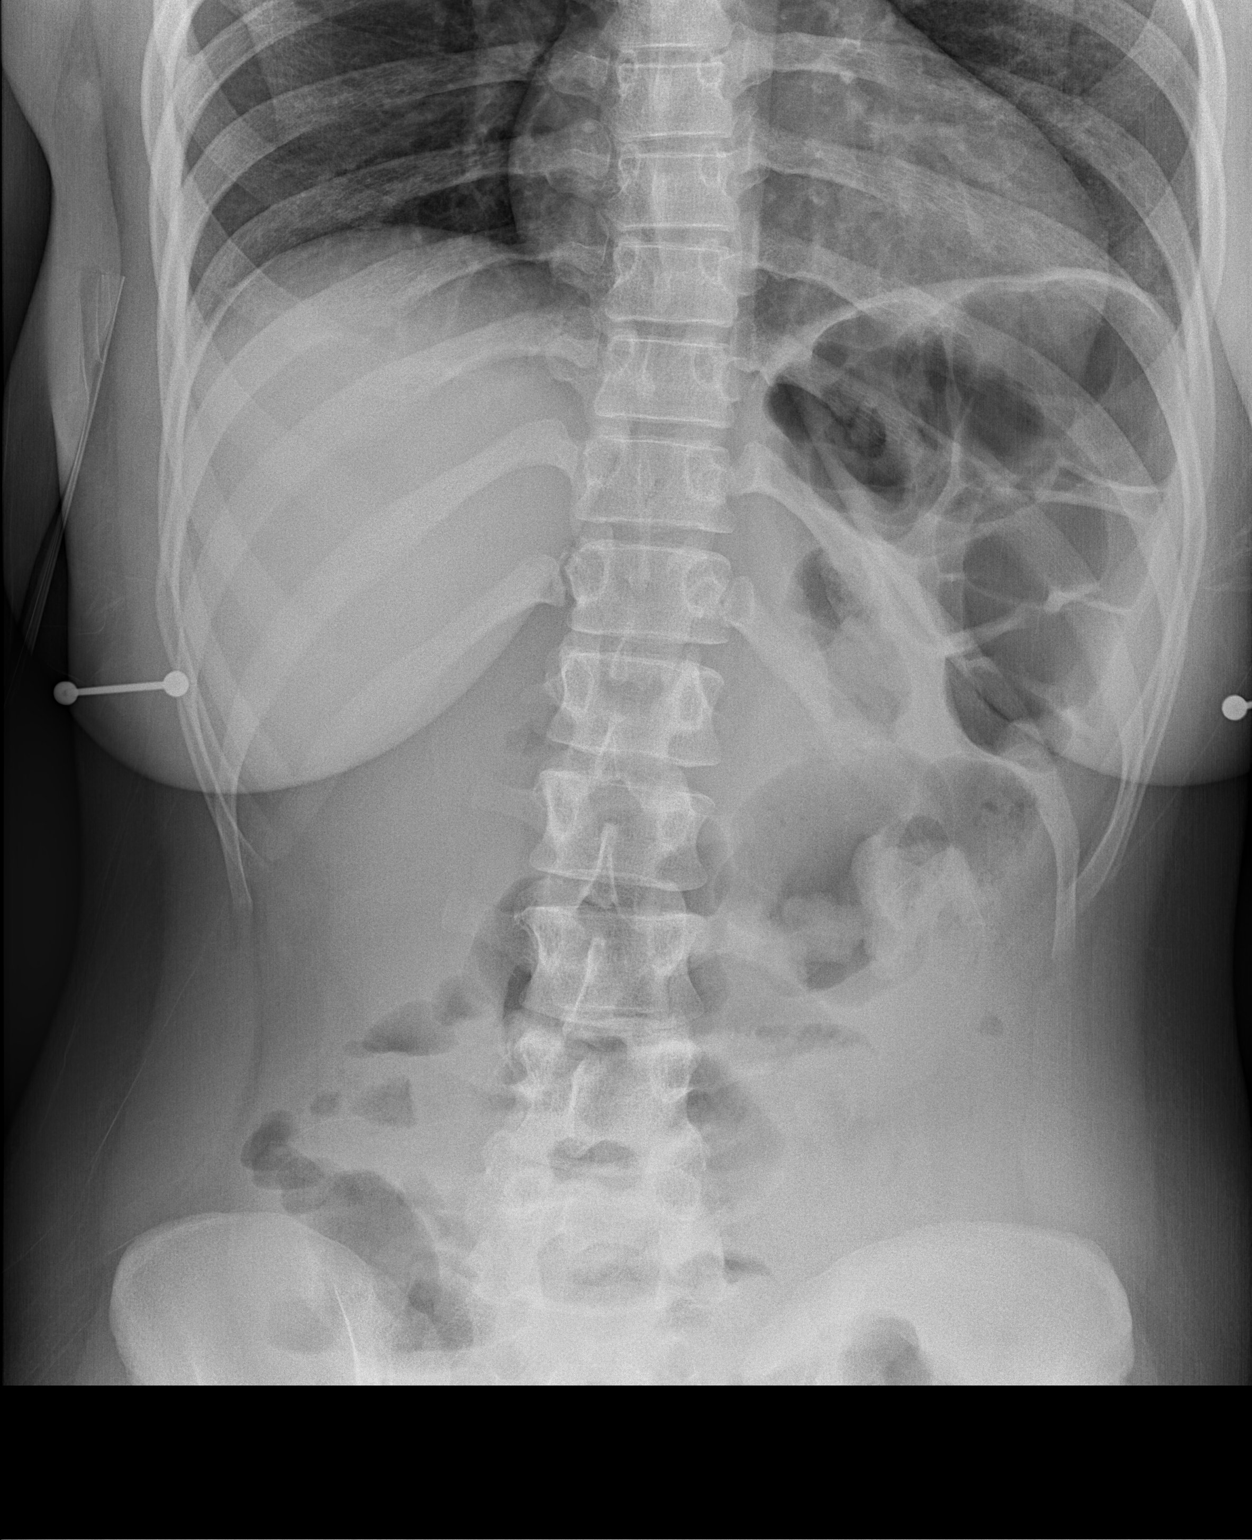

[t abdomen supine]
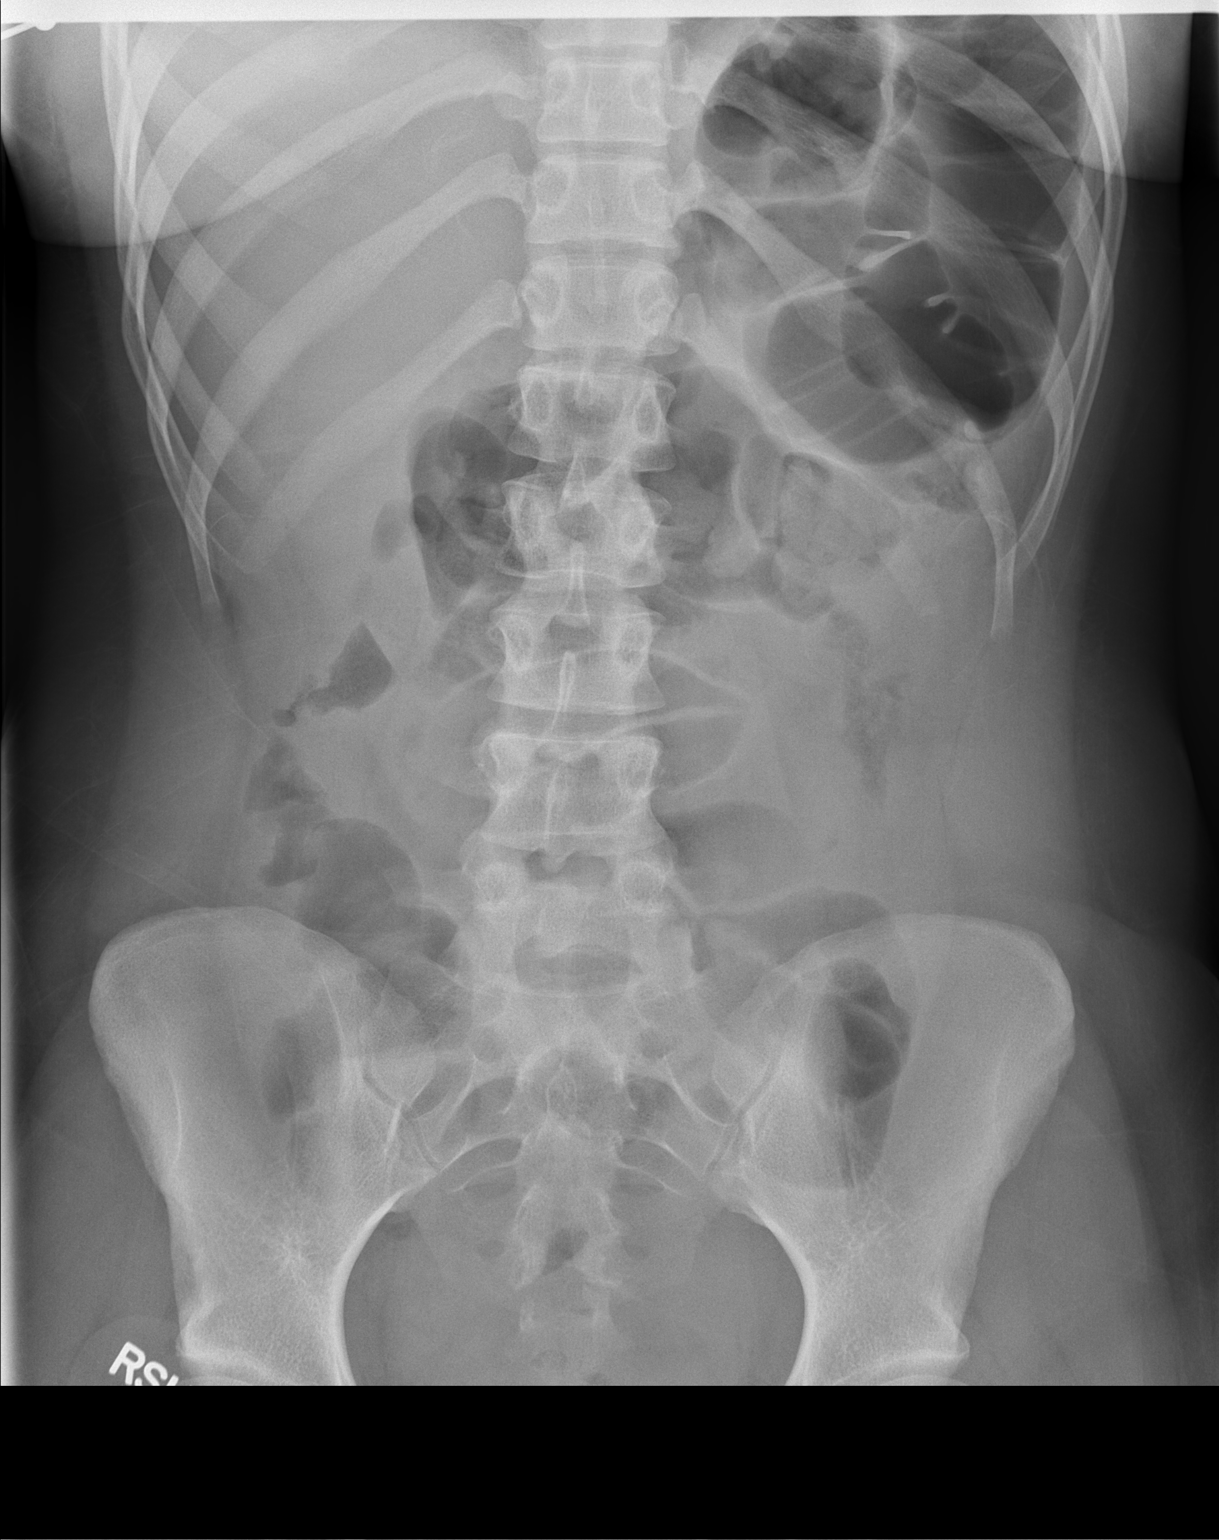

[2 of 2 positions shown; findings below may reference images not displayed]

FINDINGS: Scattered large and small bowel gas is noted. A few small air-fluid
levels are noted which may be related to enteritis. No obstructive
changes are seen. No free air is noted. No abnormal soft tissue mass
is seen. The bony structures are within normal limits.
IMPRESSION: Scattered air-fluid levels which may be related to enteritis. No
obstructive changes are seen.

## 2015-03-23 IMAGING — US US TRANSVAGINAL NON-OB
1 series · 13 of 25 positions shown · non-contrast
Comparison: CT abdomen and pelvis 11/16/2013 and pelvic ultrasound
07/11/2013

CLINICAL DATA: Pelvic pain.

EXAM:
TRANSABDOMINAL AND TRANSVAGINAL ULTRASOUND OF PELVIS
DOPPLER ULTRASOUND OF OVARIES
TECHNIQUE: Both transabdominal and transvaginal ultrasound examinations of the
pelvis were performed. Transabdominal technique was performed for
global imaging of the pelvis including uterus, ovaries, adnexal
regions, and pelvic cul-de-sac.
It was necessary to proceed with endovaginal exam following the
transabdominal exam to visualize the ovaries. Color and duplex
Doppler ultrasound was utilized to evaluate blood flow to the
ovaries.

[Series 1: us transvaginal non-ob · 0.24mm/px · 48 acquisitions, 13 frames shown]
[im 1/48]
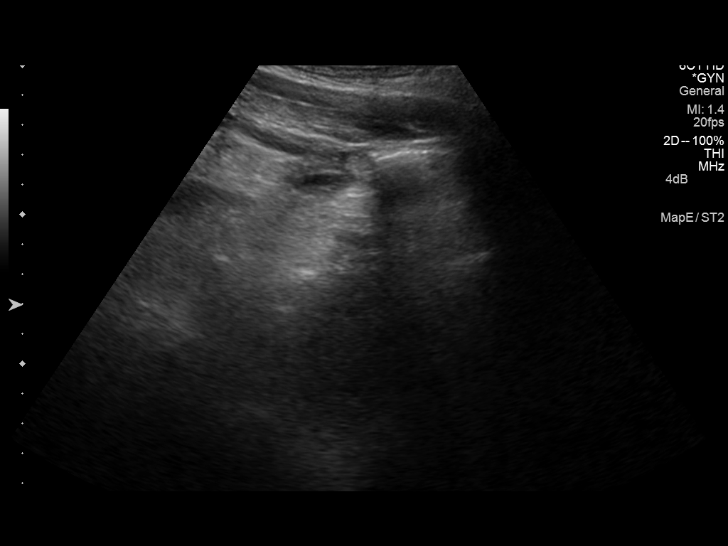
[im 4/48]
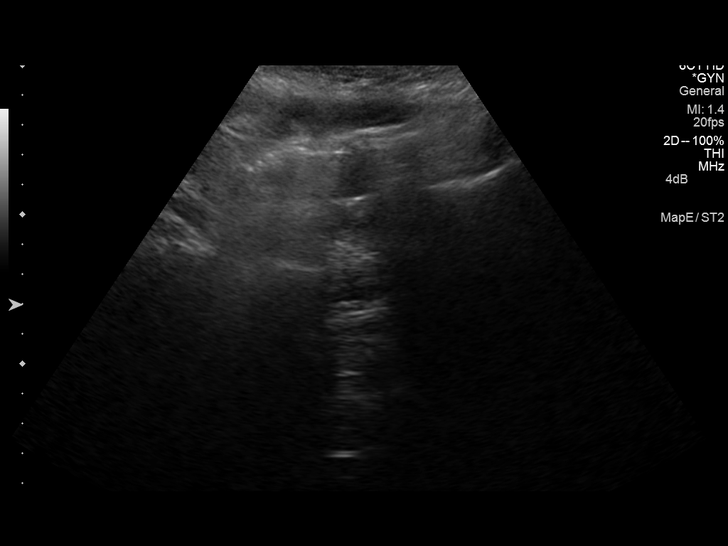
[im 8/48]
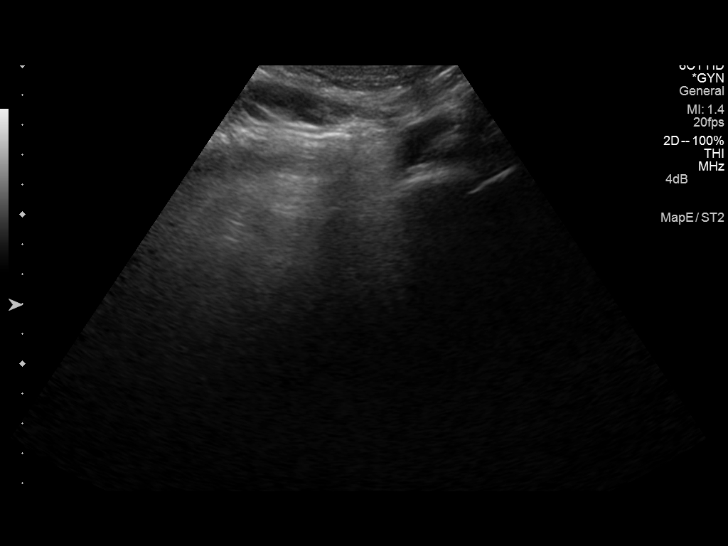
[im 12/48]
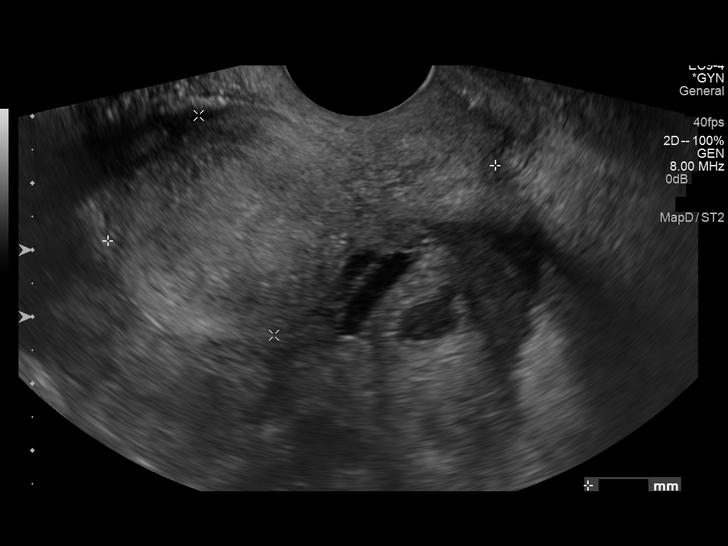
[im 16/48]
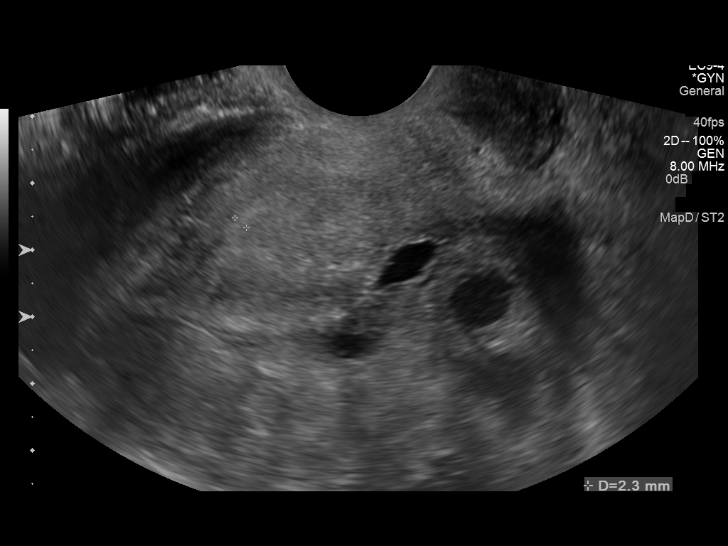
[im 20/48]
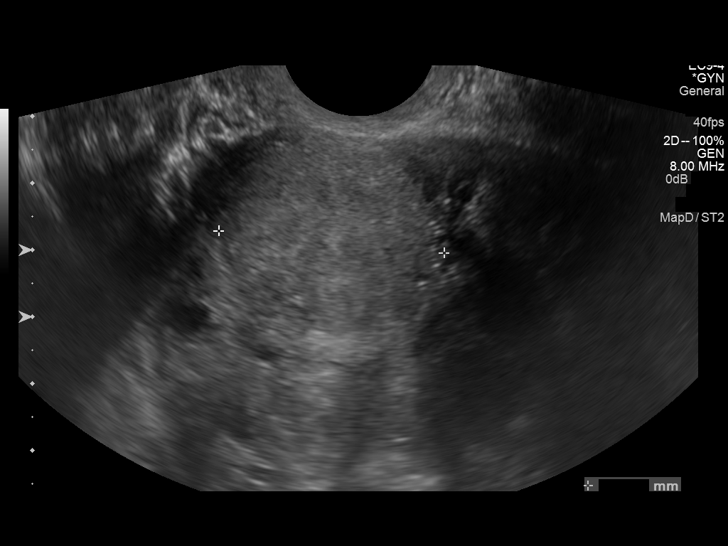
[im 24/48]
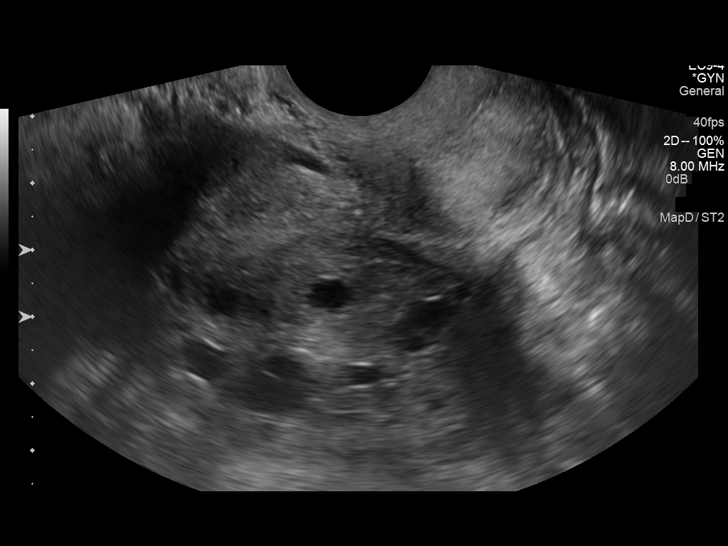
[im 28/48]
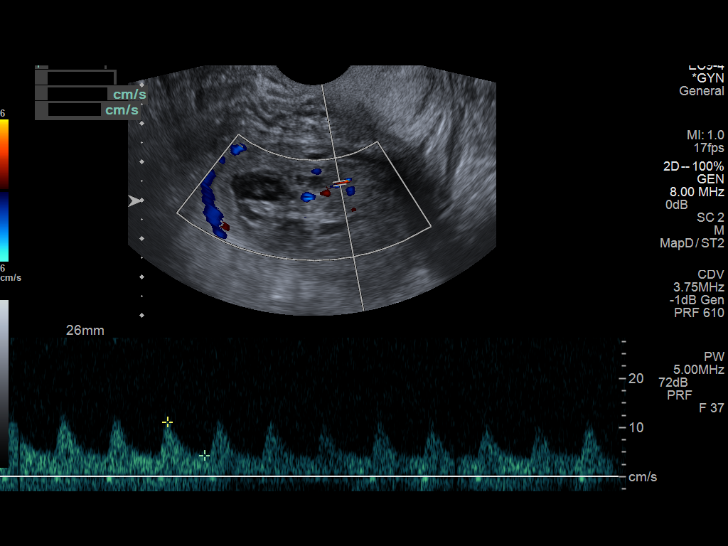
[im 32/48]
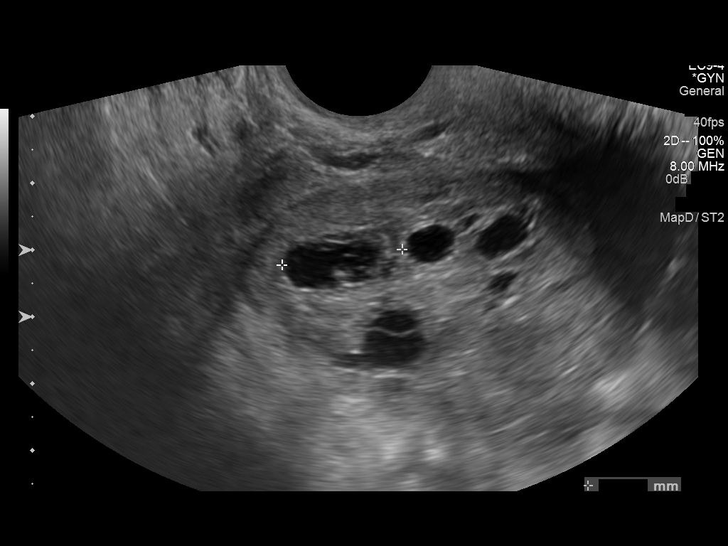
[im 36/48]
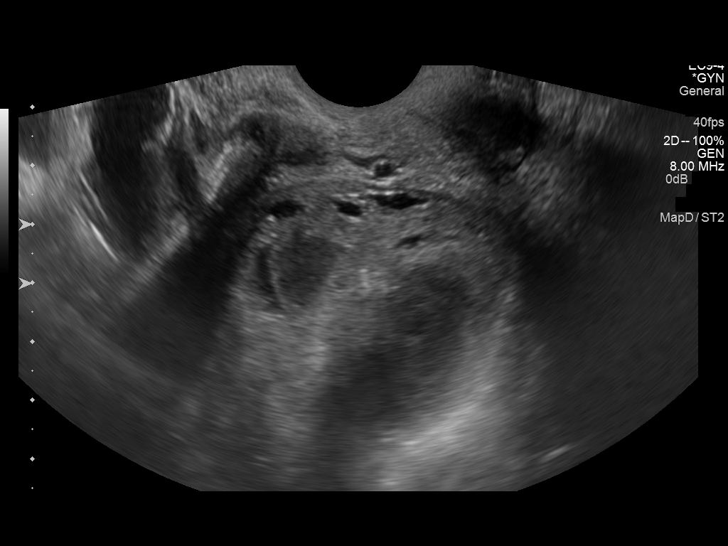
[im 40/48]
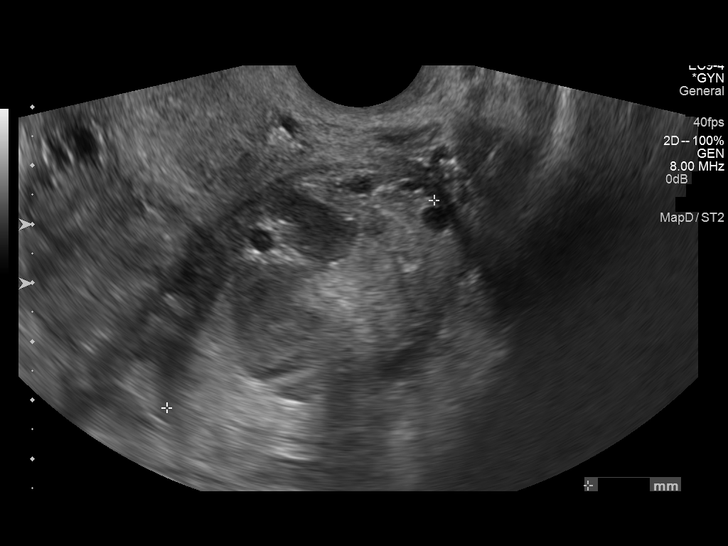
[im 44/48]
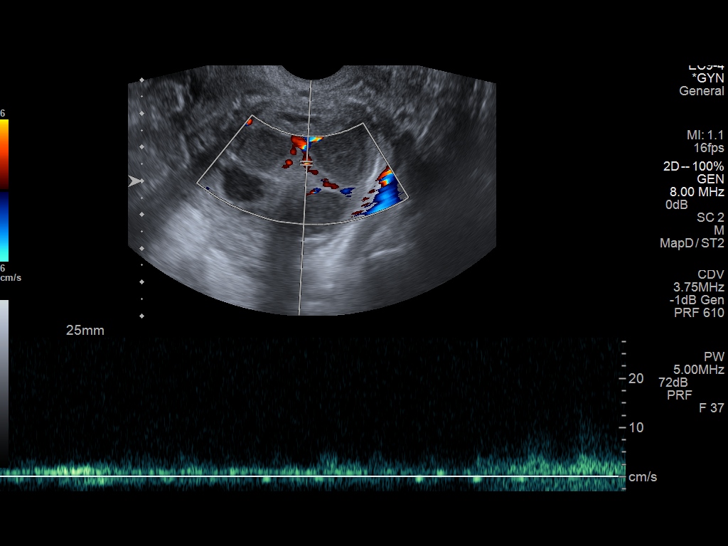
[im 48/48]
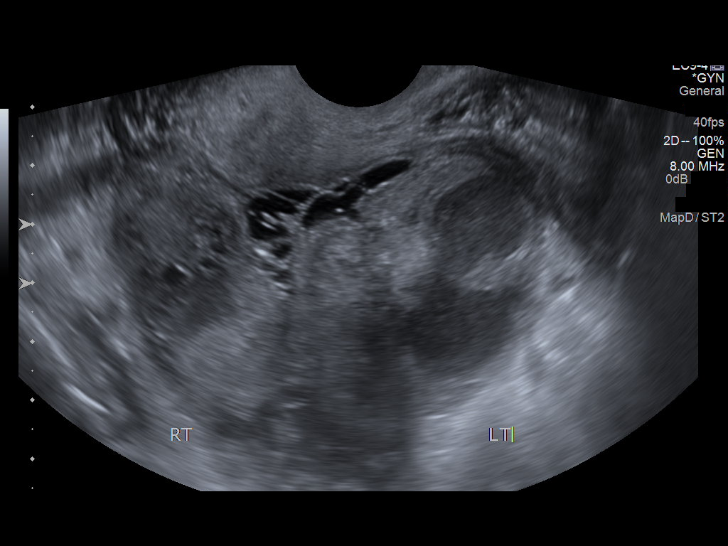

[13 of 25 positions shown; findings below may reference images not displayed]

FINDINGS: Uterus

Measurements: 5.9 x 3.5 x 3.4 cm. No fibroids or other mass
visualized.

Endometrium

Thickness: 2 mm.  No focal abnormality visualized.

Right ovary

Measurements: 5.6 x 3.0 x 2.1 cm. 1.8 x 1.1 x 1.8 cm right ovarian
cyst with scattered internal echoes, likely hemorrhagic or
involuting corpus luteum cyst.

Left ovary

Measurements: 5.8 x 4.6 x 5.8 cm. Complex, irregular, elongated
intra-ovarian cystic structure with diffuse intermediate echoes
measures at least 2.5 x 2.3 x 1.9 cm and corresponds to the
abnormality seen on CT.

Pulsed Doppler evaluation of both ovaries demonstrates normal
low-resistance arterial and venous waveforms.

Other findings

No free fluid.
IMPRESSION: Complex cystic structure in the left ovary, which when correlating
with appearance on CT remains concerning for tubo-ovarian abscess.
No evidence of ovarian torsion.
